# Patient Record
Sex: Male | Born: 1975 | Race: Black or African American | Hispanic: No | Smoking: Never smoker
Health system: Southern US, Community
[De-identification: ages and names within clinical notes are randomized; demographics above are authoritative.]

---

## 2004-11-10 ENCOUNTER — Emergency Department (HOSPITAL_COMMUNITY): Admission: EM | Admit: 2004-11-10 | Discharge: 2004-11-10 | Payer: Self-pay | Admitting: *Deleted

## 2014-01-29 ENCOUNTER — Emergency Department (HOSPITAL_COMMUNITY): Payer: 59

## 2014-01-29 ENCOUNTER — Encounter (HOSPITAL_COMMUNITY): Payer: Self-pay | Admitting: Emergency Medicine

## 2014-01-29 ENCOUNTER — Emergency Department (HOSPITAL_COMMUNITY)
Admission: EM | Admit: 2014-01-29 | Discharge: 2014-01-29 | Disposition: A | Discharge status: Home / Self Care | Payer: 59 | Attending: Emergency Medicine | Admitting: Emergency Medicine

## 2014-01-29 DIAGNOSIS — R0789 Other chest pain: Secondary | ICD-10-CM | POA: Insufficient documentation

## 2014-01-29 DIAGNOSIS — R079 Chest pain, unspecified: Secondary | ICD-10-CM

## 2014-01-29 LAB — COMPREHENSIVE METABOLIC PANEL
ALBUMIN: 3.9 g/dL (ref 3.5–5.2)
ALT: 12 U/L (ref 0–53)
AST: 25 U/L (ref 0–37)
Alkaline Phosphatase: 66 U/L (ref 39–117)
BUN: 11 mg/dL (ref 6–23)
CALCIUM: 9.6 mg/dL (ref 8.4–10.5)
CO2: 27 mEq/L (ref 19–32)
Chloride: 105 mEq/L (ref 96–112)
Creatinine, Ser: 0.99 mg/dL (ref 0.50–1.35)
GFR calc non Af Amer: >90 mL/min (ref >90)
GLUCOSE: 79 mg/dL (ref 70–99)
POTASSIUM: 4.4 meq/L (ref 3.7–5.3)
SODIUM: 142 meq/L (ref 137–147)
TOTAL PROTEIN: 7.5 g/dL (ref 6.0–8.3)
Total Bilirubin: 0.4 mg/dL (ref 0.3–1.2)

## 2014-01-29 LAB — CBC WITH DIFFERENTIAL/PLATELET
BASOS PCT: 1 % (ref 0–1)
Basophils Absolute: 0 10*3/uL (ref 0.0–0.1)
EOS ABS: 0.1 10*3/uL (ref 0.0–0.7)
EOS PCT: 2 % (ref 0–5)
HCT: 44.1 % (ref 39.0–52.0)
Hemoglobin: 15.1 g/dL (ref 13.0–17.0)
LYMPHS ABS: 1.8 10*3/uL (ref 0.7–4.0)
Lymphocytes Relative: 44 % (ref 12–46)
MCH: 27.3 pg (ref 26.0–34.0)
MCHC: 34.2 g/dL (ref 30.0–36.0)
MCV: 79.7 fL (ref 78.0–100.0)
Monocytes Absolute: 0.3 10*3/uL (ref 0.1–1.0)
Monocytes Relative: 8 % (ref 3–12)
NEUTROS PCT: 46 % (ref 43–77)
Neutro Abs: 1.9 10*3/uL (ref 1.7–7.7)
PLATELETS: 215 10*3/uL (ref 150–400)
RBC: 5.53 MIL/uL (ref 4.22–5.81)
RDW: 13.3 % (ref 11.5–15.5)
WBC: 4.2 10*3/uL (ref 4.0–10.5)

## 2014-01-29 LAB — I-STAT TROPONIN, ED: TROPONIN I, POC: 0 ng/mL (ref 0.00–0.08)

## 2014-01-29 LAB — TROPONIN I: Troponin I: <0.3 ng/mL (ref <0.30)

## 2014-01-29 MED ORDER — ASPIRIN 325 MG PO TABS
325.0000 mg | ORAL_TABLET | Freq: Once | ORAL | Status: AC
  Administered 2014-01-29: 325 mg via ORAL
  Filled 2014-01-29: qty 1

## 2014-01-29 MED ORDER — SODIUM CHLORIDE 0.9 % IV BOLUS (SEPSIS)
1000.0000 mL | Freq: Once | INTRAVENOUS | Status: AC
  Administered 2014-01-29: 1000 mL via INTRAVENOUS

## 2014-01-29 NOTE — ED Notes (Signed)
Patients urine sample collected and at the bedside.

## 2014-01-29 NOTE — ED Notes (Signed)
Pt presents with non radiating mid-sternum chest pain that is constant in nature but has intermittent episodes of increase pain x1 week. Pt denies SOB, nausea, vomiting, fever, cough, congestion, abd/back pain or  Dizziness. Pt also denies any injury, states nothing makes it better. Nothing makes it worse, pt describes it as a pressure pain, pt states he has cut back on his cardiovascular exercise this week at the gym due to increase pain.

## 2014-01-29 NOTE — Discharge Instructions (Signed)
Please call your doctor for a followup appointment within 24-48 hours. When you talk to your doctor please let them know that you were seen in the emergency department and have them acquire all of your records so that they can discuss the findings with you and formulate a treatment plan to fully care for your new and ongoing problems. Please call and set-up an appointment with Dr. Nedra HaiLee to be re-assessed by the middle of next week Please rest and stay hydrated Please continue to monitor symptoms and if symptoms are to worsen or change (fever greater than 101, chills, sweating, nausea, vomiting, dizziness, shortness of breath, difficulty breathing, chest pain worsens or changes, pain radiating down left arm, numbness, tingling, neck pain, inability to work out, blurred vision, sudden loss of vision) please report back to the ED immediately   Chest Pain (Nonspecific) It is often hard to give a specific diagnosis for the cause of chest pain. There is always a chance that your pain could be related to something serious, such as a heart attack or a blood clot in the lungs. You need to follow up with your caregiver for further evaluation. CAUSES   Heartburn.  Pneumonia or bronchitis.  Anxiety or stress.  Inflammation around your heart (pericarditis) or lung (pleuritis or pleurisy).  A blood clot in the lung.  A collapsed lung (pneumothorax). It can develop suddenly on its own (spontaneous pneumothorax) or from injury (trauma) to the chest.  Shingles infection (herpes zoster virus). The chest wall is composed of bones, muscles, and cartilage. Any of these can be the source of the pain.  The bones can be bruised by injury.  The muscles or cartilage can be strained by coughing or overwork.  The cartilage can be affected by inflammation and become sore (costochondritis). DIAGNOSIS  Lab tests or other studies, such as X-rays, electrocardiography, stress testing, or cardiac imaging, may be needed to  find the cause of your pain.  TREATMENT   Treatment depends on what may be causing your chest pain. Treatment may include:  Acid blockers for heartburn.  Anti-inflammatory medicine.  Pain medicine for inflammatory conditions.  Antibiotics if an infection is present.  You may be advised to change lifestyle habits. This includes stopping smoking and avoiding alcohol, caffeine, and chocolate.  You may be advised to keep your head raised (elevated) when sleeping. This reduces the chance of acid going backward from your stomach into your esophagus.  Most of the time, nonspecific chest pain will improve within 2 to 3 days with rest and mild pain medicine. HOME CARE INSTRUCTIONS   If antibiotics were prescribed, take your antibiotics as directed. Finish them even if you start to feel better.  For the next few days, avoid physical activities that bring on chest pain. Continue physical activities as directed.  Do not smoke.  Avoid drinking alcohol.  Only take over-the-counter or prescription medicine for pain, discomfort, or fever as directed by your caregiver.  Follow your caregiver's suggestions for further testing if your chest pain does not go away.  Keep any follow-up appointments you made. If you do not go to an appointment, you could develop lasting (chronic) problems with pain. If there is any problem keeping an appointment, you must call to reschedule. SEEK MEDICAL CARE IF:   You think you are having problems from the medicine you are taking. Read your medicine instructions carefully.  Your chest pain does not go away, even after treatment.  You develop a rash with blisters on  your chest. SEEK IMMEDIATE MEDICAL CARE IF:   You have increased chest pain or pain that spreads to your arm, neck, jaw, back, or abdomen.  You develop shortness of breath, an increasing cough, or you are coughing up blood.  You have severe back or abdominal pain, feel nauseous, or vomit.  You  develop severe weakness, fainting, or chills.  You have a fever. THIS IS AN EMERGENCY. Do not wait to see if the pain will go away. Get medical help at once. Call your local emergency services (911 in U.S.). Do not drive yourself to the hospital. MAKE SURE YOU:   Understand these instructions.  Will watch your condition.  Will get help right away if you are not doing well or get worse. Document Released: 08/01/2005 Document Revised: 01/14/2012 Document Reviewed: 05/27/2008 [facility] Patient Information 2014 [place], [place].   Emergency Department Resource Guide 1) Find a Doctor and Pay Out of Pocket Although you won't have to find out who is covered by your insurance plan, it is a good idea to ask around and get recommendations. You will then need to call the office and see if the doctor you have chosen will accept you as a new patient and what types of options they offer for patients who are self-pay. Some doctors offer discounts or will set up payment plans for their patients who do not have insurance, but you will need to ask so you aren't surprised when you get to your appointment.  2) Contact Your Local Health Department Not all health departments have doctors that can see patients for sick visits, but many do, so it is worth a call to see if yours does. If you don't know where your local health department is, you can check in your phone book. The CDC also has a tool to help you locate your state's health department, and many state websites also have listings of all of their local health departments.  3) Find a Walk-in Clinic If your illness is not likely to be very severe or complicated, you may want to try a walk in clinic. These are popping up all over the country in pharmacies, drugstores, and shopping centers. They're usually staffed by nurse practitioners or physician assistants that have been trained to treat common illnesses and complaints. They're usually fairly quick and  inexpensive. However, if you have serious medical issues or chronic medical problems, these are probably not your best option.  No Primary Care Doctor: - Call Health Connect at  [phone] - they can help you locate a primary care doctor that  accepts your insurance, provides certain services, etc. - Physician Referral Service- [phone]  Chronic Pain Problems: Organization         Address  Phone   Notes  [facility]  [phone] Patients need to be referred by their primary care doctor.   Medication Assistance: Organization         Address  Phone   Notes  [facility] Medication [facility] [address] [place], [place] [address] [phone] --Must be a resident of [facility] -- Must have NO insurance coverage whatsoever (no Medicaid/ Medicare, etc.) -- The pt. MUST have a primary care doctor that directs their care regularly and follows them in the community   [facility]ist  [phone]   [employer]  [phone]    Agencies that provide inexpensive medical care: Retail buyer  Notes  [place]  [phone]   [facility]    [phone]   [facility] [place], [place] [address] [phone]   [place] [address], [place] [phone]   Planned Parenthood    [phone]   [facility]    [phone]   [place] [address], [place] Phone:  [phone], Fax:  [phone] Hours of Operation:  9 am - 6 pm, M-F.  Also accepts Medicaid/Medicare and self-pay.  [facility] for [place], [address], [place] Phone: [phone], Fax: [phone]. Hours of Operation:  8:30 am - 5:30 pm, M-F.  Also accepts Medicaid and self-pay.  [facility] [place] [address], [place] Phone: [phone]   [place], [place], [place] [phone], Ext. [phone] Mondays & Thursdays: 7-9 AM.  First 15 patients are seen on a first come, first serve basis.    [place] Providers:  Organization         Address  Phone   Notes  [facility] [address], [place] [phone] Also accepts self-pay patients.  [facility] [address], [place]  [phone]   [place], [address], [place] [phone]   [facility] [address] [place] [phone]   Lucianne Lei [address], [place]   [phone] Only accepts [place] Access [place] patients after they have their name applied to their card.   Self-Pay (no insurance) in [facility]:  Organization         Address  Phone   Notes  Sickle Cell Patients, [facility] [place] [phone]   [facility] [place] [phone]   [facility] [place]  [place], [place], [place] [phone]   [facility]/Dr. Osei-Bonsu  [address] [place] or [address], [place] [phone] Phone number for both [place] and [place] locations is the same.  [facility] [address], [place] [phone]   [facility] [address], [place] or [address] [phone]   [facility] [address], [place] [phone], phone; [phone], fax Sees patients 1st and 3rd Saturday of every month.  Must not qualify for public or private insurance (i.e. Medicaid, Medicare, [place] Health Choice, Veterans' Benefits)  Household income should be no more than 200% of the poverty level The clinic cannot treat you if you are pregnant or  think you are pregnant  Sexually transmitted diseases are not treated at the clinic.    Dental Care: Organization         Address  Phone  Notes  [facility] Department of [facility] [place] [phone] Accepts children up to age 75 who are enrolled in [place] or [place]; pregnant women with a Medicaid card; and children who have applied for Medicaid or [place] Health Choice, but were declined, whose parents can pay a reduced fee at time of service.  [facility]  Department of [facility]  [address], [place] [phone] Accepts children up to age 20 who are enrolled in [place] or [place]; pregnant women with a Medicaid card; and children who have applied for Medicaid or  Health Choice, but were declined, whose parents can pay a reduced fee at time of service.  [place] Adult Dental Access PROGRAM  [place] [phone] Patients are seen by appointment only. Walk-ins are not accepted. [place] will see patients 73 years of age and older. Monday - Tuesday (8am-5pm) Most Wednesdays (8:30-5pm) $30 per visit, cash only  [facility] Adult Dental Access PROGRAM  [address], [facility] [phone] Patients are seen by appointment only. Walk-ins are not accepted. [place] will see patients 66 years of age and older. One Wednesday Evening (Monthly: Volunteer Based).  $30 per visit, cash only  [place]  [phone] for adults; Children under age 48, call Graduate Pediatric Dentistry at [phone]. Children aged 62-14, please call [phone] to request a pediatric application.  Dental services are provided in all areas of dental care including fillings, crowns and bridges, complete and partial dentures, implants, gum treatment, root canals, and extractions. Preventive care is also provided. Treatment is provided to both adults  and children. Patients are selected via a lottery and there is often a waiting list.   [facility] [address] [place]  [phone] [email or web address]   [facility] [address] [place], [place] [phone], Ext. [phone] Second and Fourth Thursday of each month, opens at 6:30 AM; Clinic ends at 9 AM.  Patients are seen on a first-come first-served basis, and a limited number are seen during each clinic.   [facility]  [address] Hillard Danker [place], [place] [phone]   Eligibility Requirements You must have lived in [place], [place], or [place] counties for at least the last three months.   You cannot be eligible for state or federal sponsored [employer], including [employer], [place], or [employer].   You generally cannot be eligible for healthcare insurance through your employer.    How to apply: Eligibility screenings are held every Tuesday and Wednesday afternoon from 1:00 pm until 4:00 pm. You do not need an appointment for the interview!  [facility] [address] [place], [place]   [place]  [place] Department  [place]  [phone]    Behavioral Health Resources in the Community: Intensive Outpatient Programs Organization         Address  Phone  Notes  Mason Macon. [address], [place], [place] [phone]   [facility]patient [address], [place], [place]   ADS: Alcohol & Drug Svcs [address], [place], [place]   [place] [address]  [place], [place] or [phone]   Substance Abuse Resources Organization         Address  Phone  Notes  Alcohol and Drug Services  [phone]   [place]  [phone]   [facility] [place]     Chinita Pester  [phone]   Residential & Outpatient Substance Abuse Program  [phone]   Psychological Services Organization         Address  Phone  Notes  [facility]  [phone]- [phone]   [facility]  [phone]- [phone]   [facility] [address], [place] [phone] or [phone]    Mobile Crisis Teams Organization         Address  Phone  Notes  [facility], Mobile Crisis Care Unit  [phone]   [facility]  [address] [place], [place] [phone]   Doristine LocksSharon DeEsch [address] [place] [phone]    Self-Help/Support Groups Organization         Address  Phone             Notes  [facility] [place] - variety of support groups  336- [phone] Call for more information  Narcotics Anonymous (NA), [facility] [address], [place]  2 meetings at this location   Statisticianesidential Treatment Programs Organization         Address  Phone  Notes  [facility] [address],    [place]  [phone]   [facility]  [address], [place] [phone], [place], [place] [phone]   [facility] [address], [place] [phone] Admissions: 8am-3pm M-F  [facility] [address]    [place], [place] [phone]   [facility] [address], [place], [place] [phone]   [facility] [address]  [place], [place] [phone]   [facility] - Intensive Outpatient [address], [place], [place] [phone]   [facility] ([facility] [address]  [place], [place] [phone] or [phone]   [facility] (RTS) [address] [place], [place] [phone] Accepts Medicaid  [facility] [address]  [place] [phone] Substance Abuse/Addiction Treatment   [facility]  Behavioral Health Resources Organization         Address  Phone  Notes  [facility]  [phone]) [phone]   Angie FavaJulie Brannon, PhD [address], [address] [place], [place]   [phone] or [phone]   [facility] [place]   [address] [place], [place] [phone]   [facility] [address], [place], [place] [phone]) [phone] Insurance/Medicaid/sponsorship through [facility]  [facility] Families [address]                                    [place], [place] [phone]) [phone] Therapy/tele-psych/case  [facility] [address]   [place], [place] [phone](336) [phone]    Dr. Lolly MustacheArfeen  [phone]   [facility]  [employer] [facility] 1) [address], [place] 2) [address], [place] 3)  [address], [place] [phone]  [phone](336) [phone]   [facility] Child Abuse Hotline [phone](336) [phone] or [phone](336) [phone] (After Hours)

## 2014-01-29 NOTE — ED Notes (Signed)
Pt c/o mid sternal CP x 3 days that has been constant with intermittent episodes with sharp increased pain; pt denies SOB

## 2014-01-29 NOTE — ED Provider Notes (Signed)
CSN: [id number]     Arrival date & time 01/29/14  0911 History   First MD Initiated Contact with Patient 01/29/14 [phone]     Chief Complaint  Patient presents with  . Chest Pain     (Consider location/radiation/quality/duration/timing/severity/associated sxs/prior Treatment) Patient is a 38 y.o. male presenting with chest pain. The history is provided by the patient. No language interpreter was used.  Chest Pain Associated symptoms: no cough, no dizziness, no fever, no shortness of breath and no weakness   Todd Bond is a 39 year old male with no known significant past medical history presenting to the ED with chest tightness is been ongoing intermittently for the past 3 days. Patient reported that the chest tightness is localized to the Center the chest without radiation-reported that the discomfort occurs with rest and with exertion. Stated that when he woke up this morning he noticed that the chest pain was lingering. Stated that approximately 2-3 weeks ago he had an episode of sharp pain localized to the Center the chest described as a stabbing sensation that lasted only a minute. Denied use of any over-the-counter medications. Reported that approximately 8-9 years ago he had similar symptoms reduced checked out. Reported that he works out approximately 5 days per week. Denied cigarette recreational drug abuse. Reported occasional alcohol use. Denied history of diabetes and hypertension. Denied sweating, shortness of breath, difficulty breathing, nausea, vomiting, diarrhea, numbness, tingling, weakness, syncopal episode. ECP Dr. Nedra Hai  History reviewed. No pertinent past medical history. History reviewed. No pertinent past surgical history. History reviewed. No pertinent family history. History  Substance Use Topics  . Smoking status: Never Smoker   . Smokeless tobacco: Not on file  . Alcohol Use: Yes     Comment: occ    Review of Systems  Constitutional: Negative for fever and  chills.  Respiratory: Positive for chest tightness. Negative for cough and shortness of breath.   Cardiovascular: Positive for chest pain.  Neurological: Negative for dizziness and weakness.  All other systems reviewed and are negative.      Allergies  Review of patient's allergies indicates no known allergies.  Home Medications   Current Outpatient Rx  Name  Route  Sig  Dispense  Refill  . loratadine (CLARITIN) 10 MG tablet   Oral   Take 10 mg by mouth daily.          BP 113/70  Pulse 70  Temp(Src) 97.4 F (36.3 C) (Oral)  Resp 15  Ht 5\' 10"  (1.778 m)  Wt 180 lb (81.647 kg)  BMI 25.83 kg/m2  SpO2 100% Physical Exam  Nursing note and vitals reviewed. Constitutional: He is oriented to person, place, and time. He appears well-developed and well-nourished. No distress.  HENT:  Head: Normocephalic and atraumatic.  Mouth/Throat: Oropharynx is clear and moist. No oropharyngeal exudate.  Eyes: Conjunctivae are normal. Pupils are equal, round, and reactive to light. Right eye exhibits no discharge.  Neck: Normal range of motion. Neck supple. No tracheal deviation present.  Cardiovascular: Normal rate, regular rhythm and normal heart sounds.  Exam reveals no friction rub.   No murmur heard. Cap refill less than 3 seconds Negative calf pain upon palpation - negative Homan's sign bilaterally Negative asymmetry noted to the legs bilaterally - negative swelling or pitting edema noted  Pulmonary/Chest: Effort normal and breath sounds normal. No respiratory distress. He has no wheezes. He has no rales. He exhibits no tenderness.  Patient stable to speak in full sentences without difficulty Negative pain  upon palpation to the chest wall  Musculoskeletal: Normal range of motion.  Full ROM to upper and lower extremities without difficulty noted, negative ataxia noted.  Lymphadenopathy:    He has no cervical adenopathy.  Neurological: He is alert and oriented to person, place, and  time. No cranial nerve deficit. He exhibits normal muscle tone. Coordination normal.  Cranial nerves III-XII grossly intact Strength 5+/5+ to upper and lower extremities bilaterally with resistance applied, equal distribution noted  Skin: Skin is warm and dry. No rash noted. He is not diaphoretic. No erythema.  Psychiatric: He has a normal mood and affect. His behavior is normal. Thought content normal.    ED Course  Procedures (including critical care time)  Results for orders placed during the hospital encounter of 01/29/14  CBC WITH DIFFERENTIAL      Result Value Ref Range   WBC 4.2  4.0 - 10.5 K/uL   RBC 5.53  4.22 - 5.81 MIL/uL   Hemoglobin 15.1  13.0 - 17.0 g/dL   HCT 81.1  91.4 - 78.2 %   MCV 79.7  78.0 - 100.0 fL   MCH 27.3  26.0 - 34.0 pg   MCHC 34.2  30.0 - 36.0 g/dL   RDW 95.6  21.3 - 08.6 %   Platelets 215  150 - 400 K/uL   Neutrophils Relative % 46  43 - 77 %   Neutro Abs 1.9  1.7 - 7.7 K/uL   Lymphocytes Relative 44  12 - 46 %   Lymphs Abs 1.8  0.7 - 4.0 K/uL   Monocytes Relative 8  3 - 12 %   Monocytes Absolute 0.3  0.1 - 1.0 K/uL   Eosinophils Relative 2  0 - 5 %   Eosinophils Absolute 0.1  0.0 - 0.7 K/uL   Basophils Relative 1  0 - 1 %   Basophils Absolute 0.0  0.0 - 0.1 K/uL  COMPREHENSIVE METABOLIC PANEL      Result Value Ref Range   Sodium 142  137 - 147 mEq/L   Potassium 4.4  3.7 - 5.3 mEq/L   Chloride 105  96 - 112 mEq/L   CO2 27  19 - 32 mEq/L   Glucose, Bld 79  70 - 99 mg/dL   BUN 11  6 - 23 mg/dL   Creatinine, Ser 5.78  0.50 - 1.35 mg/dL   Calcium 9.6  8.4 - 46.9 mg/dL   Total Protein 7.5  6.0 - 8.3 g/dL   Albumin 3.9  3.5 - 5.2 g/dL   AST 25  0 - 37 U/L   ALT 12  0 - 53 U/L   Alkaline Phosphatase 66  39 - 117 U/L   Total Bilirubin 0.4  0.3 - 1.2 mg/dL   GFR calc non Af Amer >90  >90 mL/min   GFR calc Af Amer >90  >90 mL/min  TROPONIN I      Result Value Ref Range   Troponin I <0.30  <0.30 ng/mL  TROPONIN I      Result Value Ref Range    Troponin I <0.30  <0.30 ng/mL  I-STAT TROPOININ, ED      Result Value Ref Range   Troponin i, poc 0.00  0.00 - 0.08 ng/mL   Comment 3             Labs Review Labs Reviewed  CBC WITH DIFFERENTIAL  COMPREHENSIVE METABOLIC PANEL  TROPONIN I  TROPONIN I  I-STAT TROPOININ, ED   Imaging  Review Dg Chest Port 1 View  01/29/2014   CLINICAL DATA:  Chest pain  EXAM: PORTABLE CHEST - 1 VIEW  COMPARISON:  DG CHEST 2V dated 12/10/2013  FINDINGS: The heart size and mediastinal contours are within normal limits. Both lungs are clear. The visualized skeletal structures are unremarkable.  IMPRESSION: No active disease.   Electronically Signed   By: Elige KoHetal  Patel   On: 01/29/2014 10:20     EKG Interpretation   Date/Time:  Friday January 29 2014 09:15:25 EDT Ventricular Rate:  88 PR Interval:  154 QRS Duration: 80 QT Interval:  360 QTC Calculation: 435 R Axis:   20 Text Interpretation:  Normal sinus rhythm Normal ECG since last tracing no  significant change Confirmed by Effie ShyWENTZ  MD, ELLIOTT [id number] on 01/29/2014  10:40:57 AM      MDM   Final diagnoses:  Chest pain   Medications  sodium chloride 0.9 % bolus 1,000 mL (0 mLs Intravenous Stopped 01/29/14 1151)  aspirin tablet 325 mg (325 mg Oral Given 01/29/14 1102)   Filed Vitals:   01/29/14 1115 01/29/14 1130 01/29/14 1145 01/29/14 1200  BP: 125/82 117/83 119/77 113/70  Pulse: 68 72 71 70  Temp:      TempSrc:      Resp: 8 16 13 15   Height:      Weight:      SpO2: 100% 100% 100% 100%    Patient presenting to the ED with chest pain that started approximately 3 days ago described as a tightness localized to the Center the chest without radiation. Denied any shortness of breath or difficulty breathing. Reported that he works at approximately 5 days per week was lifting weights and cardio. Denied cigarette use or recreational drug abuse. Reported occasional alcohol use. Alert and oriented. GCS 15. Heart rate and rhythm normal. Lungs clear to  auscultation to upper and lower lobes bilaterally. Cap refill less than 3 seconds. Radial and DP pulses 2+ bilaterally. Negative leg swelling-negative asymmetry noted-negative pitting edema identified. Negative pain upon palpation to cast bilaterally-negative Homans sign. Full range of motion to upper lower tremors bilaterally without difficulty or ataxia. Strength intact and equal distribution. EKG noted normal sinus rhythm with a heart rate of 80 beats per minute-negative ischemic findings noted, negative new changes noted. Troponin negative elevation. CBC negative findings history to the liposuction noted. CMP noted negative findings - kidney and liver functioning within normal limits. Chest x-ray negative for acute cortical pulmonary disease.  HEART score 1. Wells' Score low - doubt PE. Doubt ACS. Patient presenting to the ED with atypical chest pain. Patient comfortable and chest pain free in the ED setting. Patient stable, afebrile. Discharged patient. Discussed with patient to rest and stay hydrated. Discussed with patient to follow-up with PCP. Discussed with patient to closely monitor symptoms and if symptoms are to worsen or change to report back to the ED - strict return instructions given.  Patient agreed to plan of care, understood, all questions answered.   Todd MuttonMarissa Ata Pecha, PA-C 01/29/14 1840

## 2014-01-31 NOTE — ED Provider Notes (Signed)
Medical screening examination/treatment/procedure(s) were performed by non-physician practitioner and as supervising physician I was immediately available for consultation/collaboration.   EKG Interpretation   Date/Time:  Friday January 29 2014 09:15:25 EDT Ventricular Rate:  88 PR Interval:  154 QRS Duration: 80 QT Interval:  360 QTC Calculation: 435 R Axis:   20 Text Interpretation:  Normal sinus rhythm Normal ECG since last tracing no  significant change Confirmed by Effie ShyWENTZ  MD, Arlita Buffkin [phone]([id number]) on 01/29/2014  10:40:57 AM       Todd MelterElliott L Delmos Velaquez, MD 01/31/14 2131

## 2015-07-23 IMAGING — CR DG CHEST 1V PORT
1 series · 1 of 1 positions shown · non-contrast
Comparison: DG CHEST 2V dated 12/10/2013

CLINICAL DATA: Chest pain

EXAM:
PORTABLE CHEST - 1 VIEW

[AP]
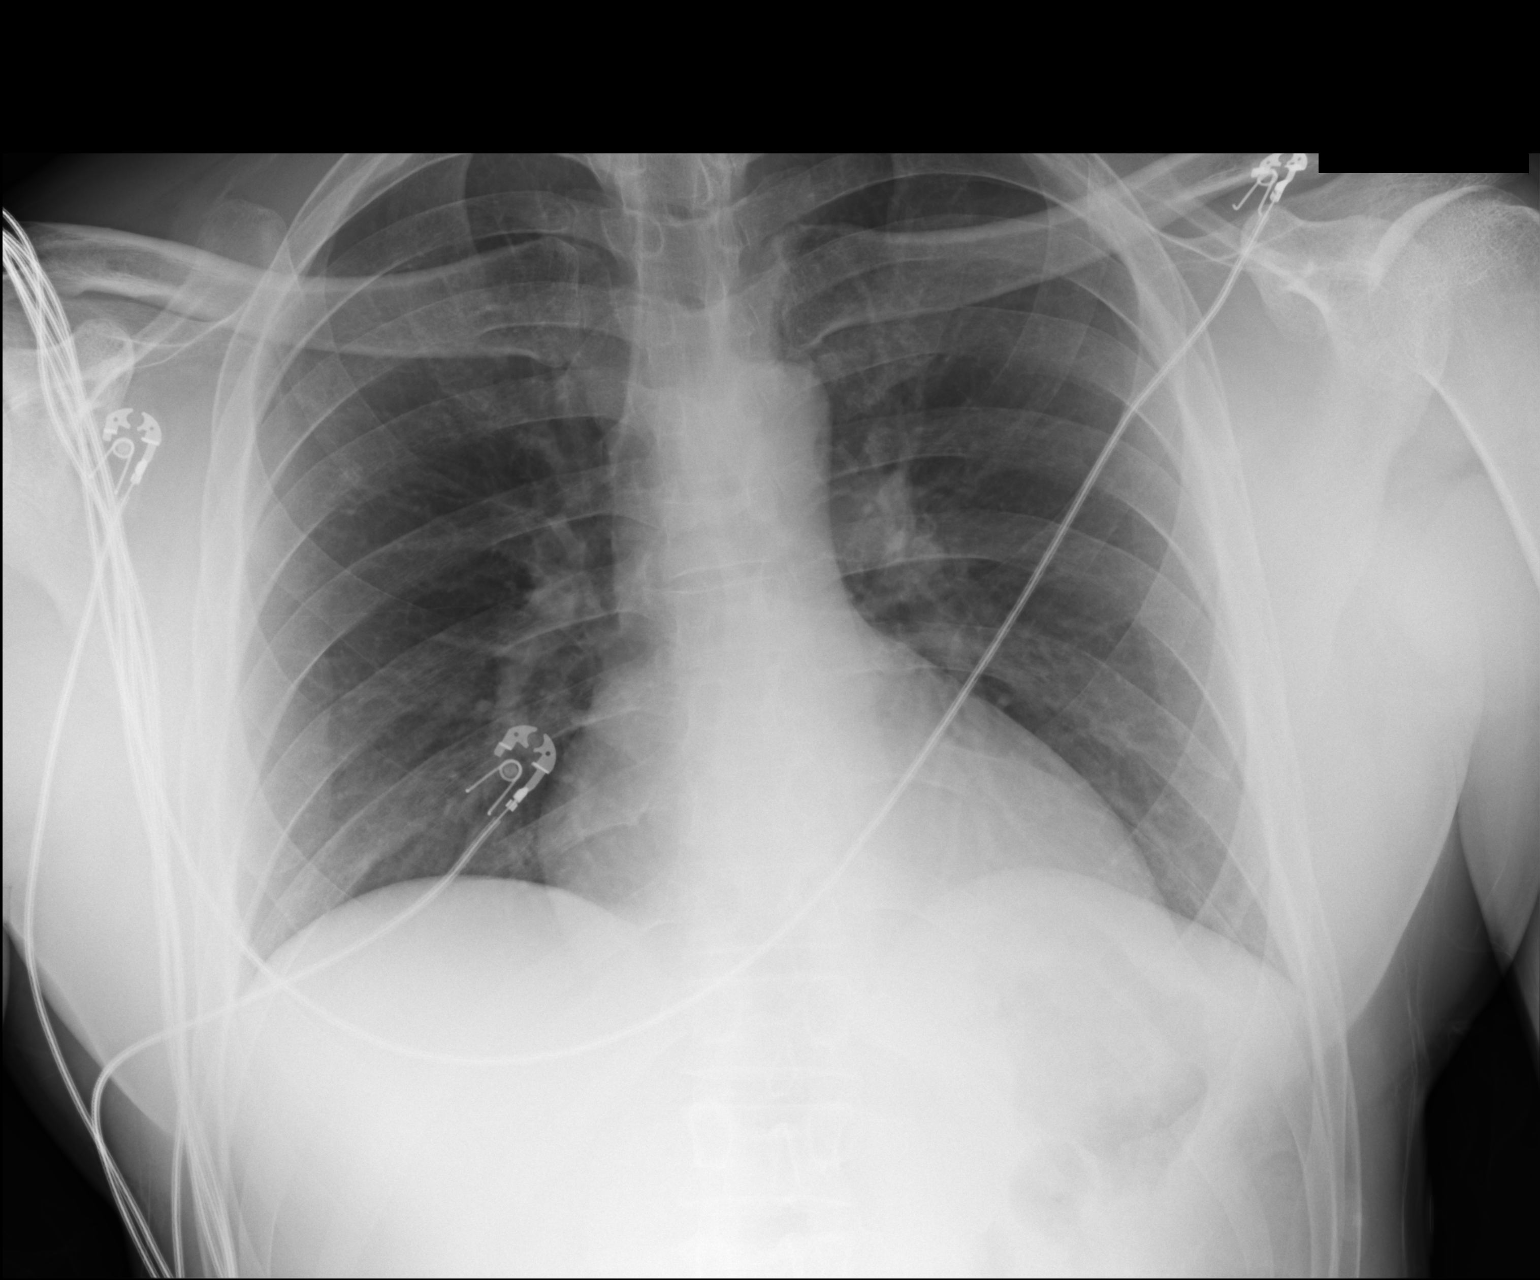

[1 of 1 positions shown; findings below may reference images not displayed]

FINDINGS: The heart size and mediastinal contours are within normal limits.
Both lungs are clear. The visualized skeletal structures are
unremarkable.
IMPRESSION: No active disease.

## 2017-04-04 DIAGNOSIS — J209 Acute bronchitis, unspecified: Secondary | ICD-10-CM | POA: Diagnosis not present

## 2017-08-15 DIAGNOSIS — G5603 Carpal tunnel syndrome, bilateral upper limbs: Secondary | ICD-10-CM | POA: Diagnosis not present

## 2017-08-15 DIAGNOSIS — Z0000 Encounter for general adult medical examination without abnormal findings: Secondary | ICD-10-CM | POA: Diagnosis not present

## 2017-08-15 DIAGNOSIS — Z23 Encounter for immunization: Secondary | ICD-10-CM | POA: Diagnosis not present

## 2017-11-21 DIAGNOSIS — J209 Acute bronchitis, unspecified: Secondary | ICD-10-CM | POA: Diagnosis not present

## 2018-07-09 DIAGNOSIS — B370 Candidal stomatitis: Secondary | ICD-10-CM | POA: Diagnosis not present

## 2018-07-09 DIAGNOSIS — K219 Gastro-esophageal reflux disease without esophagitis: Secondary | ICD-10-CM | POA: Diagnosis not present

## 2018-07-09 DIAGNOSIS — M545 Low back pain: Secondary | ICD-10-CM | POA: Diagnosis not present

## 2018-07-25 DIAGNOSIS — K219 Gastro-esophageal reflux disease without esophagitis: Secondary | ICD-10-CM | POA: Diagnosis not present

## 2018-07-25 DIAGNOSIS — M545 Low back pain: Secondary | ICD-10-CM | POA: Diagnosis not present

## 2018-07-25 DIAGNOSIS — B370 Candidal stomatitis: Secondary | ICD-10-CM | POA: Diagnosis not present

## 2018-08-18 DIAGNOSIS — Z0000 Encounter for general adult medical examination without abnormal findings: Secondary | ICD-10-CM | POA: Diagnosis not present

## 2018-08-18 DIAGNOSIS — G5603 Carpal tunnel syndrome, bilateral upper limbs: Secondary | ICD-10-CM | POA: Diagnosis not present

## 2018-08-18 DIAGNOSIS — R030 Elevated blood-pressure reading, without diagnosis of hypertension: Secondary | ICD-10-CM | POA: Diagnosis not present

## 2018-08-18 DIAGNOSIS — Z23 Encounter for immunization: Secondary | ICD-10-CM | POA: Diagnosis not present

## 2018-08-25 DIAGNOSIS — M545 Low back pain: Secondary | ICD-10-CM | POA: Diagnosis not present

## 2018-08-25 DIAGNOSIS — B370 Candidal stomatitis: Secondary | ICD-10-CM | POA: Diagnosis not present

## 2018-08-25 DIAGNOSIS — K219 Gastro-esophageal reflux disease without esophagitis: Secondary | ICD-10-CM | POA: Diagnosis not present

## 2018-09-17 DIAGNOSIS — R030 Elevated blood-pressure reading, without diagnosis of hypertension: Secondary | ICD-10-CM | POA: Diagnosis not present

## 2018-09-17 DIAGNOSIS — K219 Gastro-esophageal reflux disease without esophagitis: Secondary | ICD-10-CM | POA: Diagnosis not present

## 2018-09-17 DIAGNOSIS — G609 Hereditary and idiopathic neuropathy, unspecified: Secondary | ICD-10-CM | POA: Diagnosis not present

## 2018-10-15 DIAGNOSIS — R030 Elevated blood-pressure reading, without diagnosis of hypertension: Secondary | ICD-10-CM | POA: Diagnosis not present

## 2018-10-15 DIAGNOSIS — K219 Gastro-esophageal reflux disease without esophagitis: Secondary | ICD-10-CM | POA: Diagnosis not present

## 2018-10-15 DIAGNOSIS — G609 Hereditary and idiopathic neuropathy, unspecified: Secondary | ICD-10-CM | POA: Diagnosis not present

## 2018-10-22 DIAGNOSIS — R070 Pain in throat: Secondary | ICD-10-CM | POA: Diagnosis not present

## 2018-10-22 DIAGNOSIS — R0982 Postnasal drip: Secondary | ICD-10-CM | POA: Diagnosis not present

## 2018-10-22 DIAGNOSIS — R05 Cough: Secondary | ICD-10-CM | POA: Diagnosis not present

## 2018-12-14 DIAGNOSIS — J0140 Acute pansinusitis, unspecified: Secondary | ICD-10-CM | POA: Diagnosis not present

## 2018-12-14 DIAGNOSIS — H6983 Other specified disorders of Eustachian tube, bilateral: Secondary | ICD-10-CM | POA: Diagnosis not present

## 2018-12-16 DIAGNOSIS — K219 Gastro-esophageal reflux disease without esophagitis: Secondary | ICD-10-CM | POA: Diagnosis not present

## 2018-12-16 DIAGNOSIS — G609 Hereditary and idiopathic neuropathy, unspecified: Secondary | ICD-10-CM | POA: Diagnosis not present

## 2018-12-16 DIAGNOSIS — M545 Low back pain: Secondary | ICD-10-CM | POA: Diagnosis not present

## 2018-12-26 DIAGNOSIS — M545 Low back pain: Secondary | ICD-10-CM | POA: Diagnosis not present

## 2018-12-29 DIAGNOSIS — M545 Low back pain: Secondary | ICD-10-CM | POA: Diagnosis not present

## 2019-01-05 DIAGNOSIS — G609 Hereditary and idiopathic neuropathy, unspecified: Secondary | ICD-10-CM | POA: Diagnosis not present

## 2019-01-05 DIAGNOSIS — K219 Gastro-esophageal reflux disease without esophagitis: Secondary | ICD-10-CM | POA: Diagnosis not present

## 2019-01-05 DIAGNOSIS — M545 Low back pain: Secondary | ICD-10-CM | POA: Diagnosis not present
